# Patient Record
Sex: Male | Born: 1955 | Race: White | Hispanic: No | Marital: Married | Smoking: Current every day smoker
Health system: Southern US, Community
[De-identification: ages and names within clinical notes are randomized; demographics above are authoritative.]

## PROBLEM LIST (undated history)

## (undated) DIAGNOSIS — K635 Polyp of colon: Secondary | ICD-10-CM

## (undated) DIAGNOSIS — K5792 Diverticulitis of intestine, part unspecified, without perforation or abscess without bleeding: Secondary | ICD-10-CM

## (undated) DIAGNOSIS — I10 Essential (primary) hypertension: Secondary | ICD-10-CM

## (undated) DIAGNOSIS — G4730 Sleep apnea, unspecified: Secondary | ICD-10-CM

## (undated) DIAGNOSIS — K279 Peptic ulcer, site unspecified, unspecified as acute or chronic, without hemorrhage or perforation: Secondary | ICD-10-CM

## (undated) DIAGNOSIS — K449 Diaphragmatic hernia without obstruction or gangrene: Secondary | ICD-10-CM

## (undated) HISTORY — DX: Peptic ulcer, site unspecified, unspecified as acute or chronic, without hemorrhage or perforation: K27.9

## (undated) HISTORY — DX: Polyp of colon: K63.5

## (undated) HISTORY — DX: Essential (primary) hypertension: I10

## (undated) HISTORY — DX: Diverticulitis of intestine, part unspecified, without perforation or abscess without bleeding: K57.92

## (undated) HISTORY — DX: Sleep apnea, unspecified: G47.30

## (undated) HISTORY — DX: Diaphragmatic hernia without obstruction or gangrene: K44.9

---

## 2013-09-01 ENCOUNTER — Encounter: Payer: Self-pay | Admitting: Family Medicine

## 2013-09-01 ENCOUNTER — Ambulatory Visit (INDEPENDENT_AMBULATORY_CARE_PROVIDER_SITE_OTHER): Admitting: Family Medicine

## 2013-09-01 VITALS — BP 108/60 | HR 80 | Temp 99.2°F | Resp 18 | Ht 67.0 in | Wt 197.0 lb

## 2013-09-01 DIAGNOSIS — K219 Gastro-esophageal reflux disease without esophagitis: Secondary | ICD-10-CM

## 2013-09-01 DIAGNOSIS — R197 Diarrhea, unspecified: Secondary | ICD-10-CM

## 2013-09-01 DIAGNOSIS — F17200 Nicotine dependence, unspecified, uncomplicated: Secondary | ICD-10-CM

## 2013-09-01 DIAGNOSIS — Z860100 Personal history of colon polyps, unspecified: Secondary | ICD-10-CM | POA: Insufficient documentation

## 2013-09-01 DIAGNOSIS — Z1211 Encounter for screening for malignant neoplasm of colon: Secondary | ICD-10-CM

## 2013-09-01 DIAGNOSIS — Z720 Tobacco use: Secondary | ICD-10-CM

## 2013-09-01 DIAGNOSIS — G4733 Obstructive sleep apnea (adult) (pediatric): Secondary | ICD-10-CM

## 2013-09-01 NOTE — Progress Notes (Signed)
  Subjective:    Patient ID: Louis Ward, male    DOB: 12/12/1956, 57 y.o.   MRN: [id number]  HPI  Pt here to establish care. 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 Previous PCP Dr. Aline August in [place] Diarrhea x 4 weeks, but now improved.  history of diverticulitis and colon polyps, denies abd pain, had 1 episode of spotting in stool. Due for repeat colonoscopy.  History of peptic ulcer diagnosed on EGD a few years ago, continues to have heartburn but eats foods that regular aggravate it.  Labs done a few months ago in [place]  OSA on CPAP since 2008, sleep study in [place] . Previously treated for HTN but taken off of meds with weight loss and treatment of OSA. No current meds    Review of Systems  GEN- denies fatigue, fever, weight loss,weakness, recent illness HEENT- denies eye drainage, change in vision, nasal discharge, CVS- denies chest pain, palpitations RESP- denies SOB, cough, wheeze ABD- denies N/V, +change in stools, abd pain GU- denies dysuria, hematuria, dribbling, incontinence MSK- denies joint pain, muscle aches, injury Neuro- denies headache, dizziness, syncope, seizure activity      Objective:   Physical Exam GEN- NAD, alert and oriented x3 HEENT- PERRL, EOMI, non injected sclera, pink conjunctiva, MMM, oropharynx clear Neck- Supple,no LAD CVS- RRR, no murmur RESP-CTAB ABD-NABS,soft, NT,ND EXT- No edema Pulses- Radial, DP- 2+ Psych- normal affect and mood       Assessment & Plan:

## 2013-09-01 NOTE — Assessment & Plan Note (Signed)
Improved now, no recent antibiotics Will send to GI for colonoscopy He will bring me recent fasting labs

## 2013-09-01 NOTE — Assessment & Plan Note (Signed)
He declines any medications, has had EGD with ulcer in the past He could improve on diet as well

## 2013-09-01 NOTE — Assessment & Plan Note (Signed)
Refer to GI 

## 2013-09-01 NOTE — Patient Instructions (Signed)
Release of records for Dr. Aline August Referral to GI for colonoscopy  Work on the tobacco Bring me a copy of your labs  F/U 1 year or as needed

## 2013-09-01 NOTE — Assessment & Plan Note (Signed)
Obtain records continue CPAP

## 2013-09-01 NOTE — Assessment & Plan Note (Signed)
Counseled on cessation 

## 2013-09-14 ENCOUNTER — Encounter: Payer: Self-pay | Admitting: Internal Medicine

## 2013-10-11 ENCOUNTER — Encounter: Payer: Self-pay | Admitting: Internal Medicine

## 2013-10-14 ENCOUNTER — Encounter: Payer: Self-pay | Admitting: Internal Medicine

## 2013-10-14 ENCOUNTER — Ambulatory Visit (INDEPENDENT_AMBULATORY_CARE_PROVIDER_SITE_OTHER): Admitting: Internal Medicine

## 2013-10-14 VITALS — BP 150/80 | HR 62 | Ht 68.0 in | Wt 199.0 lb

## 2013-10-14 DIAGNOSIS — R197 Diarrhea, unspecified: Secondary | ICD-10-CM

## 2013-10-14 DIAGNOSIS — R194 Change in bowel habit: Secondary | ICD-10-CM

## 2013-10-14 DIAGNOSIS — Z86010 Personal history of colonic polyps: Secondary | ICD-10-CM

## 2013-10-14 DIAGNOSIS — R198 Other specified symptoms and signs involving the digestive system and abdomen: Secondary | ICD-10-CM

## 2013-10-14 MED ORDER — SACCHAROMYCES BOULARDII 250 MG PO CAPS: 250.0000 mg | ORAL_CAPSULE | Freq: Two times a day (BID) | ORAL | Status: DC

## 2013-10-14 MED ORDER — MOVIPREP 100 G PO SOLR: ORAL | Status: DC

## 2013-10-14 NOTE — Progress Notes (Signed)
Patient ID: Louis Ward, male   DOB: 1956/03/05, 57 y.o.   MRN: [id number] HPI: Louis Ward is a 57 yo male with PMH of OSA, HTN, colonic polyps, diverticulosis, and H. 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 Jeanice Lim to evaluate change in bowel habits and history of colon polyps. He is here alone today. Reports in July of 2014 he developed 4-5 weeks of diarrhea and lower abdominal pain. There is persistent for at least a month and all of his stools were watery or loose. He also had right lower quadrant abdominal cramping or stabbing pains during this time. He noted one episode of dark blood mixed with his diarrhea. These symptoms resolved entirely August 2014, but his stool consistency he has not returned to normal. He is no longer having diarrhea or abdominal pain. No further blood in his stool. His stools are now soft and formed but not normal shape or consistency for him. He is having 1-2 stools a day at this point. He is eating well with no nausea or vomiting. No heartburn. No fevers or chills.  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  He brings these reports today.  Past Medical History  Diagnosis Date  . Sleep apnea with use of continuous positive airway pressure (CPAP)   . Hypertension   . Colon polyps   . Diverticulitis   . Peptic ulcer     History reviewed. No pertinent past surgical history.  Current Outpatient Prescriptions  Medication Sig Dispense Refill  . MOVIPREP 100 G SOLR Use per prep instruction  1 kit  0  . saccharomyces boulardii (FLORASTOR) 250 MG capsule Take 1 capsule (250 mg total) by mouth 2 (two) times daily.       No current facility-administered medications for this visit.    No Known Allergies  Family History  Problem Relation Age of Onset  . Hypertension Mother   . Arthritis Mother   . Cancer Sister     lung  . Stroke Paternal Grandmother   . Cancer Sister      lung cancer/ breast  . Dementia Father   . Colon cancer Maternal Uncle 70  . Lung cancer Father     History  Substance Use Topics  . Smoking status: Current Every Day Smoker -- 1.00 packs/day for 25 years    Types: Cigarettes  . Smokeless tobacco: Not on file  . Alcohol Use: Yes     Comment: 2-3 drinks per day    ROS: As per history of present illness, otherwise negative  BP 150/80  Pulse 62  Ht 5\' 8"  (1.727 m)  Wt 199 lb (90.266 kg)  BMI 30.26 kg/m2 Constitutional: Well-developed and well-nourished. No distress. HEENT: Normocephalic and atraumatic. Oropharynx is clear and moist. No oropharyngeal exudate. Conjunctivae are normal.  No scleral icterus. Neck: Neck supple. Trachea midline. Cardiovascular: Normal rate, regular rhythm and intact distal pulses. No M/R/G Pulmonary/chest: Effort normal and breath sounds normal. No wheezing, rales or rhonchi. Abdominal: Soft, nontender, nondistended. Bowel sounds active throughout. There are no masses palpable. No hepatosplenomegaly. Extremities: no clubbing, cyanosis, or edema Lymphadenopathy: No cervical adenopathy noted. Neurological: Alert and oriented to person place and time. Skin: Skin is warm and dry. No rashes noted. Psychiatric: Normal mood and affect. Behavior is normal.  Colon - CAPT Delia Chimes, [facility], [facility] -- 05/04/2008 -- diffuse widely scattered diverticulosis most dense in the sigmoid, 6  mm sigmoid polyp removed with cold snare, 3 mm sessile rectal polyp removed with cold forceps. Pathology results unavailable Upper endoscopy performed same day the same endoscopist -- medium size sliding have a hernia, otherwise normal esophagus, normal stomach, normal duodenum which was biopsied Pathology results unavailable  ASSESSMENT/PLAN: 57 yo male with PMH of OSA, HTN, colonic polyps, diverticulosis, and H. 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 Jeanice Lim to evaluate  change in bowel habits and history of colon polyps.   1.  Change in bowel habits -- his symptoms in July are most consistent with infectious enteritis versus colitis.  These symptoms resolved though his bowel habits have not completely returned to normal. 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  He has not had any further bleeding nor significant abdominal pain or diarrhea.  I have recommended a month course of Florastor 250 mg BID.  Hopefully provide it will allow his bowel habits returned completely to normal. Should he develop recurrent diarrhea or abdominal pain, he is asked to notify me. We are proceeding with colonoscopy see #2  2.  Hx of colon polyps -- 2 colon polyps removed in 2009. Pathology results are unavailable, and thus we will assume these polyps were adenomatous. With this assumption, he would be due for repeat screening/surveillance colonoscopy at this time. We discussed colonoscopy including the risks and benefits and he is agreeable to proceed. Colonoscopy is being performed for screening, but will also document no ongoing inflammatory process to explain #1

## 2013-10-14 NOTE — Patient Instructions (Signed)
You have been scheduled for a colonoscopy with propofol. Please follow written instructions given to you at your visit today.  Please pick up your prep kit at the pharmacy within the next 1-3 days. If you use inhalers (even only as needed), please bring them with you on the day of your procedure. Your physician has requested that you go to www.startemmi.com and enter the access code given to you at your visit today. This web site gives a general overview about your procedure. However, you should still follow specific instructions given to you by our office regarding your preparation for the procedure.  We have sent the following medications to your pharmacy for you to pick up at your convenience: Moviprep, Florastor, take twice a day for 1 month, then you can take a probiotic daily                                                We are excited to introduce MyChart, a new best-in-class service that provides you online access to important information in your electronic medical record. We want to make it easier for you to view your health information - all in one secure location - when and where you need it. We expect MyChart will enhance the quality of care and service we provide.  When you register for MyChart, you can:    View your test results.    Request appointments and receive appointment reminders via email.    Request medication renewals.    View your medical history, allergies, medications and immunizations.    Communicate with your physician's office through a password-protected site.    Conveniently print information such as your medication lists.  To find out if MyChart is right for you, please talk to a member of our clinical staff today. We will gladly answer your questions about this free health and wellness tool.  If you are age 28 or older and want a member of your family to have access to your record, you must provide written consent by completing a proxy form available at  our office. Please speak to our clinical staff about guidelines regarding accounts for patients younger than age 40.  As you activate your MyChart account and need any technical assistance, please call the MyChart technical support line at [phone]) or email your question to [email or web address]. If you email your question(s), please include your name, a return phone number and the best time to reach you.  If you have non-urgent health-related questions, you can send a message to our office through MyChart at [email or web address]. If you have a medical emergency, call 911.  Thank you for using MyChart as your new health and wellness resource!   MyChart licensed from [employer],  4540-9811. Patents Pending.

## 2013-10-18 ENCOUNTER — Encounter: Payer: Self-pay | Admitting: Internal Medicine

## 2013-10-18 ENCOUNTER — Ambulatory Visit (AMBULATORY_SURGERY_CENTER): Admitting: Internal Medicine

## 2013-10-18 VITALS — BP 111/69 | HR 53 | Temp 98.0°F | Resp 30 | Ht 68.0 in | Wt 199.0 lb

## 2013-10-18 DIAGNOSIS — D126 Benign neoplasm of colon, unspecified: Secondary | ICD-10-CM

## 2013-10-18 DIAGNOSIS — R194 Change in bowel habit: Secondary | ICD-10-CM

## 2013-10-18 DIAGNOSIS — R198 Other specified symptoms and signs involving the digestive system and abdomen: Secondary | ICD-10-CM

## 2013-10-18 DIAGNOSIS — Z86010 Personal history of colonic polyps: Secondary | ICD-10-CM

## 2013-10-18 DIAGNOSIS — Z1211 Encounter for screening for malignant neoplasm of colon: Secondary | ICD-10-CM

## 2013-10-18 MED ORDER — SODIUM CHLORIDE 0.9 % IV SOLN: 500.0000 mL | INTRAVENOUS | Status: DC

## 2013-10-18 NOTE — Progress Notes (Signed)
Patient denies any allergies to eggs or soy. 

## 2013-10-18 NOTE — Progress Notes (Signed)
Patient did not have preoperative order for IV antibiotic SSI prophylaxis. (G8918)  Patient did not experience any of the following events: a burn prior to discharge; a fall within the facility; wrong site/side/patient/procedure/implant event; or a hospital transfer or hospital admission upon discharge from the facility. (G8907)  

## 2013-10-18 NOTE — Progress Notes (Signed)
Called to room to assist during endoscopic procedure.  Patient ID and intended procedure confirmed with present staff. Received instructions for my participation in the procedure from the performing physician.  

## 2013-10-18 NOTE — Op Note (Signed)
[facility] [address] [place], [address]   COLONOSCOPY PROCEDURE REPORT  PATIENT: Louis, Ward  MR#: [id number] BIRTHDATE: 1956/06/29 , 57  yrs. old GENDER: Male ENDOSCOPIST: Beverley Fiedler, MD REFERRED XB:[id number] [place], M.D. PROCEDURE DATE:  10/18/2013 PROCEDURE:   Colonoscopy with cold biopsy polypectomy First Screening Colonoscopy - Avg.  risk and is 50 yrs.  old or older - No.  Prior Negative Screening - Now for repeat screening. N/A  History of Adenoma - Now for follow-up colonoscopy & has been > or = to 3 yrs.  Yes hx of adenoma.  Has been 3 or more years since last colonoscopy.  Polyps Removed Today? Yes. ASA CLASS:   Class II INDICATIONS:Patient's personal history of colon polyps, Last colonoscopy performed 5 years ago, and Change in bowel habits. MEDICATIONS: MAC sedation, administered by CRNA and propofol (Diprivan) 400mg  IV  DESCRIPTION OF PROCEDURE:   After the risks benefits and alternatives of the procedure were thoroughly explained, informed consent was obtained.  A digital rectal exam revealed no rectal mass.   The LB [id number]  endoscope was introduced through the anus and advanced to the terminal ileum which was intubated for a short distance. No adverse events experienced.   The quality of the prep was good, using MoviPrep  The instrument was then slowly withdrawn as the colon was fully examined.   COLON FINDINGS: The mucosa appeared normal in the terminal ileum. Three sessile polyps ranging between 3-17mm in size were found in the rectosigmoid colon.  Polypectomy was performed with cold forceps.  All resections were complete and all polyp tissue was completely retrieved.   Moderate diverticulosis was noted at the splenic flexure, in the descending colon, and sigmoid colon. Retroflexed views revealed internal hemorrhoids. The time to cecum=3 minutes 11 seconds.  Withdrawal time=14 minutes 00 seconds. The scope was withdrawn and  the procedure completed.  COMPLICATIONS: There were no complications.  ENDOSCOPIC IMPRESSION: 1.   Normal mucosa in the terminal ileum 2.   Three sessile polyps ranging between 3-61mm in size were found in the rectosigmoid colon; Polypectomy was performed with cold forceps 3.   Moderate diverticulosis was noted at the splenic flexure, in the descending colon, and sigmoid colon  RECOMMENDATIONS: 1.  Await pathology results 2.  High fiber diet 3.  Timing of repeat colonoscopy will be determined by pathology findings. 4.  You will receive a letter within 1-2 weeks with the results of your biopsy as well as final recommendations.  Please call my office if you have not received a letter after 3 weeks 5.  Continue Florastor 250 mg twice daily for at least a month, can continue longer if desired   eSigned:  Beverley Fiedler, MD 10/18/2013 2:31 PM   cc: The Patient and Milinda Antis MD   PATIENT NAME:  Louis, Ward MR#: [id number]

## 2013-10-18 NOTE — Progress Notes (Signed)
Stable to RR 

## 2013-10-18 NOTE — Patient Instructions (Signed)

## 2013-10-19 ENCOUNTER — Telehealth: Payer: Self-pay | Admitting: *Deleted

## 2013-10-19 NOTE — Telephone Encounter (Signed)
Message left

## 2013-10-25 ENCOUNTER — Encounter: Payer: Self-pay | Admitting: Internal Medicine

## 2014-09-29 ENCOUNTER — Ambulatory Visit (INDEPENDENT_AMBULATORY_CARE_PROVIDER_SITE_OTHER): Admitting: Family Medicine

## 2014-09-29 DIAGNOSIS — Z23 Encounter for immunization: Secondary | ICD-10-CM

## 2015-01-26 ENCOUNTER — Encounter: Payer: Self-pay | Admitting: Family Medicine

## 2015-03-25 ENCOUNTER — Emergency Department (HOSPITAL_COMMUNITY)
Admission: EM | Admit: 2015-03-25 | Discharge: 2015-03-25 | Disposition: A | Discharge status: Home / Self Care | Attending: Emergency Medicine | Admitting: Emergency Medicine

## 2015-03-25 ENCOUNTER — Encounter (HOSPITAL_COMMUNITY): Payer: Self-pay | Admitting: Nurse Practitioner

## 2015-03-25 ENCOUNTER — Emergency Department (HOSPITAL_COMMUNITY)

## 2015-03-25 DIAGNOSIS — Z9981 Dependence on supplemental oxygen: Secondary | ICD-10-CM | POA: Insufficient documentation

## 2015-03-25 DIAGNOSIS — I10 Essential (primary) hypertension: Secondary | ICD-10-CM | POA: Insufficient documentation

## 2015-03-25 DIAGNOSIS — Z8719 Personal history of other diseases of the digestive system: Secondary | ICD-10-CM | POA: Diagnosis not present

## 2015-03-25 DIAGNOSIS — Z720 Tobacco use: Secondary | ICD-10-CM | POA: Diagnosis not present

## 2015-03-25 DIAGNOSIS — K1120 Sialoadenitis, unspecified: Secondary | ICD-10-CM | POA: Diagnosis not present

## 2015-03-25 DIAGNOSIS — R600 Localized edema: Secondary | ICD-10-CM | POA: Diagnosis present

## 2015-03-25 DIAGNOSIS — H6121 Impacted cerumen, right ear: Secondary | ICD-10-CM | POA: Diagnosis not present

## 2015-03-25 DIAGNOSIS — Z79899 Other long term (current) drug therapy: Secondary | ICD-10-CM | POA: Insufficient documentation

## 2015-03-25 DIAGNOSIS — Z86010 Personal history of colonic polyps: Secondary | ICD-10-CM | POA: Diagnosis not present

## 2015-03-25 DIAGNOSIS — G4730 Sleep apnea, unspecified: Secondary | ICD-10-CM | POA: Insufficient documentation

## 2015-03-25 LAB — I-STAT CHEM 8, ED
BUN: 8 mg/dL (ref 6–23)
CALCIUM ION: 1.18 mmol/L (ref 1.12–1.23)
Chloride: 99 mmol/L (ref 96–112)
Creatinine, Ser: 0.8 mg/dL (ref 0.50–1.35)
Glucose, Bld: 109 mg/dL — ABNORMAL HIGH (ref 70–99)
HEMATOCRIT: 49 % (ref 39.0–52.0)
Hemoglobin: 16.7 g/dL (ref 13.0–17.0)
Potassium: 4.8 mmol/L (ref 3.5–5.1)
SODIUM: 138 mmol/L (ref 135–145)
TCO2: 29 mmol/L (ref 0–100)

## 2015-03-25 MED ORDER — IOHEXOL 300 MG/ML  SOLN
75.0000 mL | Freq: Once | INTRAMUSCULAR | Status: AC | PRN
  Administered 2015-03-25: 75 mL via INTRAVENOUS

## 2015-03-25 MED ORDER — CLINDAMYCIN HCL 300 MG PO CAPS: 300.0000 mg | ORAL_CAPSULE | Freq: Four times a day (QID) | ORAL | Status: DC

## 2015-03-25 MED ORDER — PREDNISONE 10 MG PO TABS: ORAL_TABLET | ORAL | Status: AC

## 2015-03-25 NOTE — ED Notes (Addendum)
After he spent the day outside yesterday he noticed painful swelling to R side of face in front of his ear. He applied heat with some relief but it had returned when he woke this am. He denies cough, fevers, sore throat. He has had some nasal drainage when waking in the am. He is using nasal spray and loratadine for the nasal drainage

## 2015-03-25 NOTE — ED Provider Notes (Signed)
CSN: [id number]     Arrival date & time 03/25/15  1347 History  This chart was scribed for Glendell Docker, NP working with Nat Christen, MD by Randa Evens, ED Scribe. This patient was seen in room TR07C/TR07C and the patient's care was started at 2:03 PM.     Chief Complaint  Patient presents with  . Facial Swelling   The history is provided by the patient. No language interpreter was used.   HPI Comments: Louis Ward is a 59 y.o. male who presents to the Emergency Department complaining of right sided facial swelling onset 1 day prior. Pt states that the swelling is causing him some slight ear pain.  Pt states that he applied a warm compress that provided temporary relief. Pt doesn't report any worsening factors.  Pt denies cough, fever or sore throat. Pt states that he has a Hx of sleep apnea tha he is on CPAP for and has some post nasal drainage daily.    Past Medical History  Diagnosis Date  . Sleep apnea with use of continuous positive airway pressure (CPAP)   . Colon polyps   . Diverticulitis   . Peptic ulcer   . Hiatal hernia   . Hypertension     no longer has HTN,using the CPAP   History reviewed. No pertinent past surgical history. Family History  Problem Relation Age of Onset  . Hypertension Mother   . Arthritis Mother   . Cancer Sister     lung  . Stroke Paternal Grandmother   . Cancer Sister     lung cancer/ breast  . Dementia Father   . Colon cancer Maternal Uncle 70  . Lung cancer Father    History  Substance Use Topics  . Smoking status: Current Every Day Smoker -- 1.00 packs/day for 25 years    Types: Cigarettes  . Smokeless tobacco: Never Used  . Alcohol Use: 1.8 oz/week    3 Cans of beer per week     Comment: 2-3 drinks per day    Review of Systems  Constitutional: Negative for fever.  HENT: Positive for ear pain and facial swelling. Negative for sore throat.   Respiratory: Negative for cough.   All other systems reviewed and are  negative.    Allergies  Review of patient's allergies indicates no known allergies.  Home Medications   Prior to Admission medications   Medication Sig Start Date End Date Taking? Authorizing Provider  saccharomyces boulardii (FLORASTOR) 250 MG capsule Take 1 capsule (250 mg total) by mouth 2 (two) times daily. 10/14/13   Jerene Bears, MD   BP 167/69 mmHg  Pulse 64  Temp(Src) 98 F (36.7 C) (Oral)  Resp 16  SpO2 100%   Physical Exam  Constitutional: He is oriented to person, place, and time. He appears well-developed and well-nourished. No distress.  HENT:  Head: Normocephalic and atraumatic.    Right Ear: External ear normal.  Left Ear: External ear normal.  Mouth/Throat: Oropharynx is clear and moist.  Obvious swelling noted to the area. No redness or warmth noted. Cerumen impaction to the right ear  Eyes: Conjunctivae and EOM are normal.  Neck: Neck supple. No tracheal deviation present.  Cardiovascular: Normal rate.   Pulmonary/Chest: Effort normal. No respiratory distress.  Musculoskeletal: Normal range of motion.  Neurological: He is alert and oriented to person, place, and time.  Skin: Skin is warm and dry.  Psychiatric: He has a normal mood and affect. His behavior is normal.  Nursing note and vitals reviewed.   ED Course  Procedures (including critical care time) DIAGNOSTIC STUDIES: Oxygen Saturation is 98% on RA, normal by my interpretation.    COORDINATION OF CARE: 2:12 PM-Discussed treatment plan with pt at bedside and pt agreed to plan.     Labs Review Labs Reviewed  I-STAT CHEM 8, ED - Abnormal; Notable for the following:    Glucose, Bld 109 (*)    All other components within normal limits    Imaging Review Ct Maxillofacial W/cm  03/25/2015   CLINICAL DATA:  Right pre-auricular swelling yesterday, no trauma. Initial encounter  EXAM: CT MAXILLOFACIAL WITH CONTRAST  TECHNIQUE: Multidetector CT imaging of the maxillofacial structures was  performed with intravenous contrast. Multiplanar CT image reconstructions were also generated. A small metallic BB was placed on the right temple in order to reliably differentiate right from left.  CONTRAST:  103mL OMNIPAQUE IOHEXOL 300 MG/ML  SOLN  COMPARISON:  None.  FINDINGS: Unilateral right parotid gland enlargement is noted with overlying subcutaneous soft tissue stranding but no drainable fluid collection identified. No measurable definable mass is identified, although there are adjacent likely reactive lymph nodes measuring 0.6 cm maximal short axis diameter image 70. Airways patent and midline. Moderate pansinusitis partly visualized. Orbits are unremarkable. No acute osseous abnormality or osseous destruction. Soft tissue density material within the external auditory canals is most compatible with cerumen.  IMPRESSION: Unilateral right parotid gland enlargement most compatible with parotitis, generally viral infectious or inflammatory etiology. No mass or drainable fluid collection is identified. Adjacent inflammatory change in reactive level II lymphadenopathy noted as described above.  Moderate pansinusitis.   Electronically Signed   By: Conchita Paris M.D.   On: 03/25/2015 16:20     EKG Interpretation None      MDM   Final diagnoses:  Parotitis   Pt seen with DR. Lacinda Axon. Will treat with clindamycin and steroids do the amount of swelling. Pt to seen his pcp next week. Pt given return precautions  I personally performed the services described in this documentation, which was scribed in my presence. The recorded information has been reviewed and is accurate.      Glendell Docker, NP 03/25/15 [place], MD 03/25/15 [phone]

## 2015-03-25 NOTE — ED Notes (Signed)
Attempted to remove ear wax with saline-peroxide solution- some removed but ear severally impacted

## 2015-03-25 NOTE — Discharge Instructions (Signed)
Parotitis °Parotitis is soreness and inflammation of one or both parotid glands. The parotid glands produce saliva. They are located on each side of the face, below and in front of the earlobes. The saliva produced comes out of tiny openings (ducts) inside the cheeks. In most cases, parotitis goes away over time or with treatment. If your parotitis is caused by certain long-term (chronic) diseases, it may come back again.  °CAUSES  °Parotitis can be caused by: °· Viral infections. Mumps is one viral infection that can cause parotitis. °· Bacterial infections. °· Blockage of the salivary ducts due to a salivary stone. °· Narrowing of the salivary ducts. °· Swelling of the salivary ducts. °· Dehydration. °· Autoimmune conditions, such as sarcoidosis or Sjogren syndrome. °· Air from activities such as scuba diving, glass blowing, or playing an instrument (rare). °· Human immunodeficiency virus (HIV) or acquired immunodeficiency syndrome (AIDS). °· Tuberculosis. °SIGNS AND SYMPTOMS  °· The ears may appear to be pushed up and out from their normal position. °· Redness (erythema) of the skin over the parotid glands. °· Pain and tenderness over the parotid glands. °· Swelling in the parotid gland area. °· Yellowish-white fluid (pus) coming from the ducts inside the cheeks. °· Dry mouth. °· Bad taste in the mouth. °DIAGNOSIS  °Your health care provider may determine that you have parotitis based on your symptoms and a physical exam. A sample of fluid may also be taken from the parotid gland and tested to find the cause of your infection. X-rays or computed tomography (CT) scans may be taken if your health care provider thinks you might have a salivary stone blocking your salivary duct. °TREATMENT  °Treatment varies depending upon the cause of your parotitis. If your parotitis is caused by mumps, no treatment is needed. The condition will go away on its own after 7 to 10 days. In other cases, treatment may  include: °· Antibiotic medicine if your infection was caused by bacteria. °· Pain medicines. °· Gland massage. °· Eating sour candy to increase your saliva production. °· Removal of salivary stones. Your health care provider may flush stones out with fluids or remove them with tweezers. °· Surgery to remove the parotid glands. °HOME CARE INSTRUCTIONS  °· If you were prescribed an antibiotic medicine, finish it all even if you start to feel better. °· Put warm compresses on the sore area. °· Take medicines only as directed by your health care provider. °· Drink enough fluids to keep your urine clear or pale yellow. °SEEK IMMEDIATE MEDICAL CARE IF:  °· You have increasing pain or swelling that is not controlled with medicine. °· You have a fever. °MAKE SURE YOU: °· Understand these instructions. °· Will watch your condition. °· Will get help right away if you are not doing well or get worse. °Document Released: 06/07/2002 Document Revised: 05/02/2014 Document Reviewed: 11/11/2011 °ExitCare® Patient Information ©2015 [employer]. This information is not intended to replace advice given to you by your health care provider. Make sure you discuss any questions you have with your health care provider. ° °

## 2015-03-28 ENCOUNTER — Ambulatory Visit (INDEPENDENT_AMBULATORY_CARE_PROVIDER_SITE_OTHER): Admitting: Family Medicine

## 2015-03-28 ENCOUNTER — Encounter: Payer: Self-pay | Admitting: *Deleted

## 2015-03-28 ENCOUNTER — Encounter: Payer: Self-pay | Admitting: Family Medicine

## 2015-03-28 VITALS — BP 126/70 | HR 68 | Temp 97.7°F | Resp 14 | Ht 68.0 in | Wt 194.0 lb

## 2015-03-28 DIAGNOSIS — H6123 Impacted cerumen, bilateral: Secondary | ICD-10-CM | POA: Diagnosis not present

## 2015-03-28 DIAGNOSIS — G4733 Obstructive sleep apnea (adult) (pediatric): Secondary | ICD-10-CM

## 2015-03-28 DIAGNOSIS — Z720 Tobacco use: Secondary | ICD-10-CM | POA: Diagnosis not present

## 2015-03-28 DIAGNOSIS — M25551 Pain in right hip: Secondary | ICD-10-CM

## 2015-03-28 DIAGNOSIS — Z9989 Dependence on other enabling machines and devices: Secondary | ICD-10-CM

## 2015-03-28 DIAGNOSIS — K1121 Acute sialoadenitis: Secondary | ICD-10-CM

## 2015-03-28 DIAGNOSIS — M25559 Pain in unspecified hip: Secondary | ICD-10-CM | POA: Insufficient documentation

## 2015-03-28 MED ORDER — CLINDAMYCIN HCL 300 MG PO CAPS: 300.0000 mg | ORAL_CAPSULE | Freq: Four times a day (QID) | ORAL | Status: AC

## 2015-03-28 MED ORDER — CLINDAMYCIN HCL 300 MG PO CAPS: 300.0000 mg | ORAL_CAPSULE | Freq: Four times a day (QID) | ORAL | Status: DC

## 2015-03-28 NOTE — Assessment & Plan Note (Signed)
counseled on tobacco cessation 

## 2015-03-28 NOTE — Patient Instructions (Signed)
Pick up last 6 doses of antibiotics- will take for total of 7 days Get xrays of hips CPAP supplies to be ordered F/U as needed

## 2015-03-28 NOTE — Assessment & Plan Note (Signed)
CPAP supplies to be ordered

## 2015-03-28 NOTE — Progress Notes (Signed)
Patient ID: Louis Ward, male   DOB: [birth year], 59 y.o.   MRN: [id number]   Subjective:    Patient ID: Louis Ward, male    DOB: May 02, 1956, 59 y.o.   MRN: [id number]  Patient presents for ER F/U; SP Fall; and CPAP Supplies  patient here to follow-up from the ER. He was diagnosed with right-sided parotitis, he had very acute swelling on the right side of his face which lasted for a couple of days he went to the emergency room 3/26 and he had a CT scan done the max face which did show the proctitis but there was no discrete abscess. He was started on clindamycin 300 mg 4 times a day however only given 21 tablets he was also given prednisone Dosepak and once he started this the pain and swelling decreased. He has been on a antibiotics this is day 4 and a prednisone day 3. He is now able to eat as normal. He has not had any fever no previous illness beforehand.  Sleep apnea he is doing well to see pap machine they're actually moving to [place] he is being transferred to a different Engineer, manufacturing. He needs  C-PAP supplies.  Hip pain- he complains of right hip discomfort. He feels like if he turns certain ways it feels like it is slipping out of place. He actually fell about 3 months ago going down some stairs he has significant bruising on his hip but did not seek care at that time. He is able to do his regular activities but will get a catch in his hip from time to time. He is not taking any anti-inflammatories or pain medication on a regular basis.    Review Of Systems:  GEN- denies fatigue, fever, weight loss,weakness, recent illness HEENT- denies eye drainage, change in vision, nasal discharge, CVS- denies chest pain, palpitations RESP- denies SOB, cough, wheeze ABD- denies N/V, change in stools, abd pain GU- denies dysuria, hematuria, dribbling, incontinence MSK- + joint pain, muscle aches, injury Neuro- denies headache, dizziness, syncope, seizure activity       Objective:    BP  126/70 mmHg  Pulse 68  Temp(Src) 97.7 F (36.5 C) (Oral)  Resp 14  Ht 5\' 8"  (1.727 m)  Wt 194 lb (87.998 kg)  BMI 29.50 kg/m2 GEN- NAD, alert and oriented x3 HEENT- PERRL, EOMI, non injected sclera, pink conjunctiva, MMM, oropharynx clear, nares clear, Bilat ear canals with wax impaction, Right- parotid mild swelling, mild TTP, no erythema, no fluctuance area Neck- Supple, + right anterior LAD CVS- RRR, no murmur RESP-CTAB MSK- Spine NT, good ROM spine, HIPS, neg hip rock, no leg length discrepancy, gait normal EXT- No edema  Pulses- Radial, - 2+        Assessment & Plan:      Problem List Items Addressed This Visit      Unprioritized   Tobacco use    counseled on tobacco cessation      OSA on CPAP    CPAP supplies to be ordered      Hip pain - Primary    - Exam normal today, obtain xray of hip, no Meds needed for pain at this time      Relevant Orders   DG HIP UNILAT WITH PELVIS 2-3 VIEWS RIGHT    Other Visit Diagnoses    Parotitis, acute        Will give total of 7 days of antibiotics, swelling significantly improved compared to pictures , unclear cause, no  stone seen on CT scan    Cerumen impaction, bilateral        S/P irrigationa at bedside       Note: This dictation was prepared with Dragon dictation along with smaller phrase technology. Any transcriptional errors that result from this process are unintentional.

## 2015-03-28 NOTE — Assessment & Plan Note (Signed)
-   Exam normal today, obtain xray of hip, no Meds needed for pain at this time

## 2015-03-31 ENCOUNTER — Ambulatory Visit
Admission: RE | Admit: 2015-03-31 | Discharge: 2015-03-31 | Disposition: A | Discharge status: Home / Self Care | Source: Ambulatory Visit | Attending: Family Medicine | Admitting: Family Medicine

## 2015-03-31 DIAGNOSIS — M25551 Pain in right hip: Secondary | ICD-10-CM

## 2015-04-03 ENCOUNTER — Encounter: Payer: Self-pay | Admitting: Family Medicine

## 2015-05-08 ENCOUNTER — Telehealth: Payer: Self-pay | Admitting: Family Medicine

## 2015-05-08 NOTE — Telephone Encounter (Signed)
Returned call to patient.   Reports that he received call from [facility] stating that supplies were sent out. States that he will be moving in (3) weeks, but has not received supplies.   Advised to contact CPAP supplier to find out where shipment is at this time.

## 2015-05-08 NOTE — Telephone Encounter (Signed)
[phone] (work phone) PT is wanting to speak to you in regards to his CPAP machine

## 2016-07-11 IMAGING — CR DG HIP (WITH OR WITHOUT PELVIS) 2-3V*R*
1 series · 1 of 1 positions shown · non-contrast
Comparison: None.

CLINICAL DATA: Right hip pain since the patient fell down steps at
home 3 months ago.

EXAM:
RIGHT HIP (WITH PELVIS) 2-3 VIEWS

[t hip frog leg right]
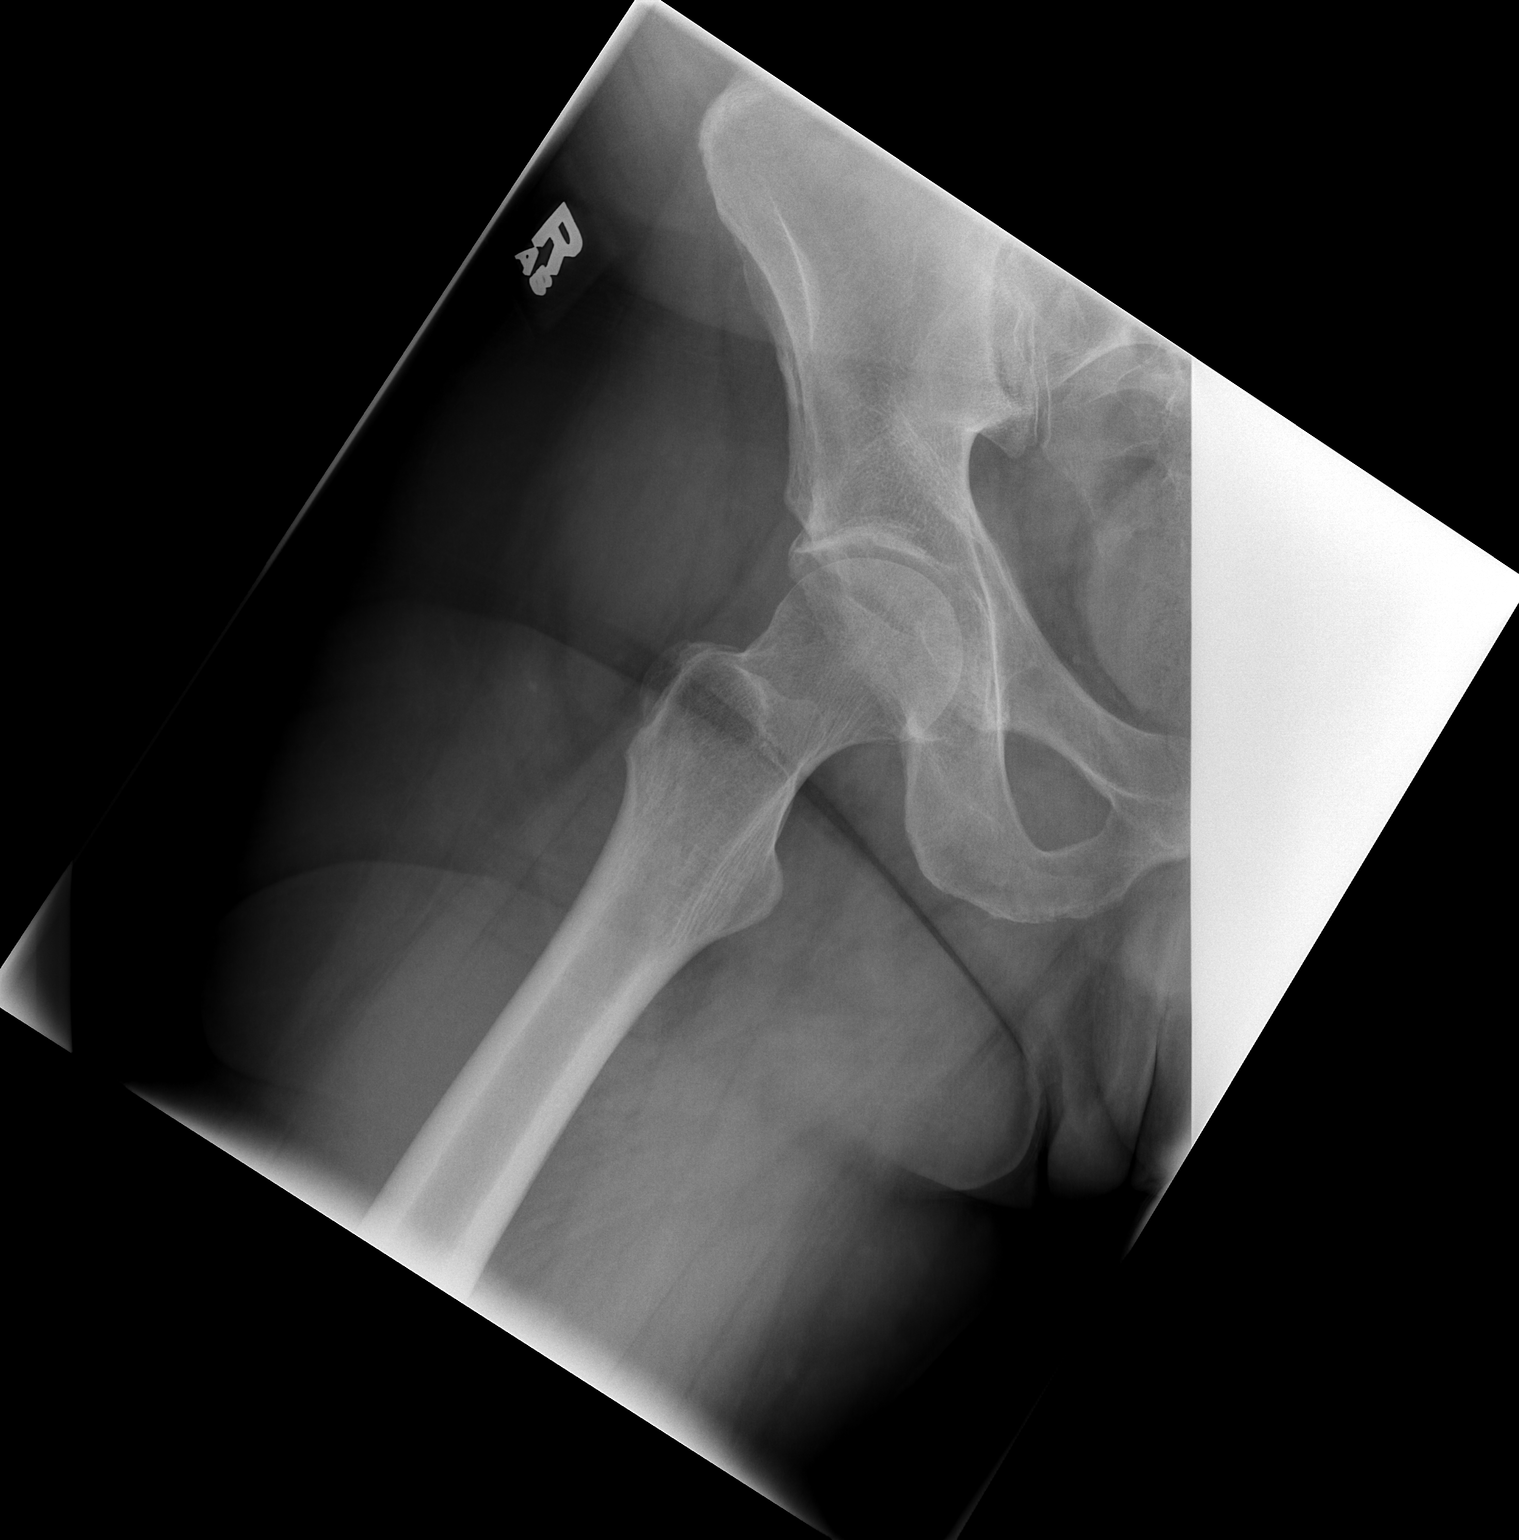

[1 of 1 positions shown; findings below may reference images not displayed]

FINDINGS: There is no fracture, dislocation, joint space narrowing, arthritic
spurring or degenerative cysts or other abnormalities. The pelvic
bones appear normal including the sacroiliac joints.
IMPRESSION: Normal exam.

## 2018-07-21 ENCOUNTER — Encounter: Payer: Self-pay | Admitting: *Deleted

## 2018-08-07 ENCOUNTER — Encounter: Payer: Self-pay | Admitting: Internal Medicine
